# Patient Record
Sex: Female | Born: 1970
Health system: Southern US, Community
[De-identification: ages and names within clinical notes are randomized; demographics above are authoritative.]

## PROBLEM LIST (undated history)

## (undated) DIAGNOSIS — F32A Depression, unspecified: Secondary | ICD-10-CM

## (undated) HISTORY — PX: TONSILLECTOMY: SUR1361

---
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
## 2005-11-30 ENCOUNTER — Ambulatory Visit: Payer: Self-pay | Admitting: Internal Medicine

## 2009-07-03 ENCOUNTER — Ambulatory Visit: Payer: Self-pay | Admitting: Internal Medicine

## 2009-07-03 DIAGNOSIS — R143 Flatulence: Secondary | ICD-10-CM

## 2009-07-03 DIAGNOSIS — K589 Irritable bowel syndrome without diarrhea: Secondary | ICD-10-CM

## 2009-07-03 DIAGNOSIS — R142 Eructation: Secondary | ICD-10-CM

## 2009-07-03 DIAGNOSIS — R141 Gas pain: Secondary | ICD-10-CM | POA: Insufficient documentation

## 2009-07-03 DIAGNOSIS — K5900 Constipation, unspecified: Secondary | ICD-10-CM | POA: Insufficient documentation

## 2009-07-03 DIAGNOSIS — R1084 Generalized abdominal pain: Secondary | ICD-10-CM | POA: Insufficient documentation

## 2009-07-03 DIAGNOSIS — K219 Gastro-esophageal reflux disease without esophagitis: Secondary | ICD-10-CM | POA: Insufficient documentation

## 2009-07-10 LAB — CONVERTED CEMR LAB
ALT: 15 units/L (ref 0–35)
AST: 22 units/L (ref 0–37)
Alkaline Phosphatase: 37 units/L — ABNORMAL LOW (ref 39–117)
Chloride: 108 meq/L (ref 96–112)
Eosinophils Absolute: 0.4 10*3/uL (ref 0.0–0.7)
Eosinophils Relative: 3.6 % (ref 0.0–5.0)
GFR calc non Af Amer: 85.32 mL/min (ref >60)
Lymphocytes Relative: 30.4 % (ref 12.0–46.0)
MCHC: 34.7 g/dL (ref 30.0–36.0)
MCV: 93.4 fL (ref 78.0–100.0)
Monocytes Absolute: 0.6 10*3/uL (ref 0.1–1.0)
Neutrophils Relative %: 59.8 % (ref 43.0–77.0)
Platelets: 220 10*3/uL (ref 150.0–400.0)
Potassium: 4.3 meq/L (ref 3.5–5.1)
RBC: 4.23 M/uL (ref 3.87–5.11)
Sodium: 141 meq/L (ref 135–145)
Total Bilirubin: 0.4 mg/dL (ref 0.3–1.2)
WBC: 11.9 10*3/uL — ABNORMAL HIGH (ref 4.5–10.5)

## 2009-07-23 ENCOUNTER — Ambulatory Visit: Payer: Self-pay | Admitting: Internal Medicine

## 2009-07-23 LAB — CONVERTED CEMR LAB
Fecal Occult Blood: NEGATIVE
OCCULT 1: NEGATIVE
OCCULT 3: NEGATIVE
OCCULT 4: NEGATIVE
OCCULT 5: NEGATIVE

## 2009-08-05 ENCOUNTER — Ambulatory Visit: Payer: Self-pay | Admitting: Internal Medicine

## 2012-02-22 ENCOUNTER — Emergency Department: Payer: Self-pay | Admitting: Emergency Medicine

## 2013-01-17 ENCOUNTER — Ambulatory Visit: Payer: Self-pay | Admitting: Obstetrics and Gynecology

## 2013-01-25 ENCOUNTER — Ambulatory Visit: Payer: Self-pay | Admitting: Obstetrics and Gynecology

## 2014-01-25 ENCOUNTER — Ambulatory Visit: Payer: Self-pay | Admitting: Obstetrics and Gynecology

## 2015-01-24 ENCOUNTER — Other Ambulatory Visit: Payer: Self-pay | Admitting: Obstetrics and Gynecology

## 2015-01-24 DIAGNOSIS — Z1231 Encounter for screening mammogram for malignant neoplasm of breast: Secondary | ICD-10-CM

## 2015-02-19 ENCOUNTER — Ambulatory Visit: Payer: Self-pay

## 2015-02-21 ENCOUNTER — Ambulatory Visit
Admission: RE | Admit: 2015-02-21 | Discharge: 2015-02-21 | Disposition: A | Discharge status: Home / Self Care | Payer: 59 | Source: Ambulatory Visit | Attending: Obstetrics and Gynecology | Admitting: Obstetrics and Gynecology

## 2015-02-21 DIAGNOSIS — Z1231 Encounter for screening mammogram for malignant neoplasm of breast: Secondary | ICD-10-CM | POA: Diagnosis not present

## 2016-01-28 ENCOUNTER — Other Ambulatory Visit: Payer: Self-pay | Admitting: Obstetrics and Gynecology

## 2016-01-28 DIAGNOSIS — Z1231 Encounter for screening mammogram for malignant neoplasm of breast: Secondary | ICD-10-CM

## 2016-02-28 ENCOUNTER — Ambulatory Visit: Payer: 59

## 2016-03-09 ENCOUNTER — Ambulatory Visit
Admission: RE | Admit: 2016-03-09 | Discharge: 2016-03-09 | Disposition: A | Discharge status: Home / Self Care | Payer: 59 | Source: Ambulatory Visit | Attending: Obstetrics and Gynecology | Admitting: Obstetrics and Gynecology

## 2016-03-09 DIAGNOSIS — Z1231 Encounter for screening mammogram for malignant neoplasm of breast: Secondary | ICD-10-CM

## 2016-06-07 IMAGING — MG MM DIGITAL SCREENING BILAT W/ CAD
6 series · 6 of 6 positions shown · non-contrast
Comparison: Previous exam(s).

CLINICAL DATA: Screening.

EXAM:
DIGITAL SCREENING BILATERAL MAMMOGRAM WITH CAD

[R MLO (1 of 2)]
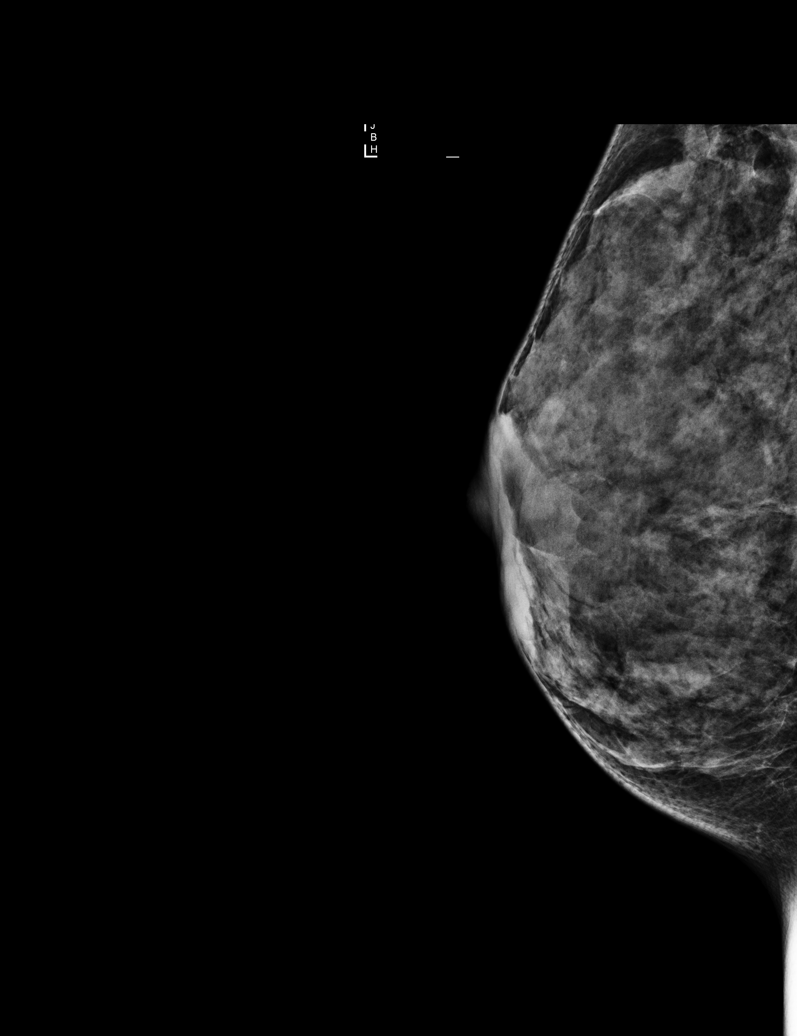

[L MLO (1 of 2)]
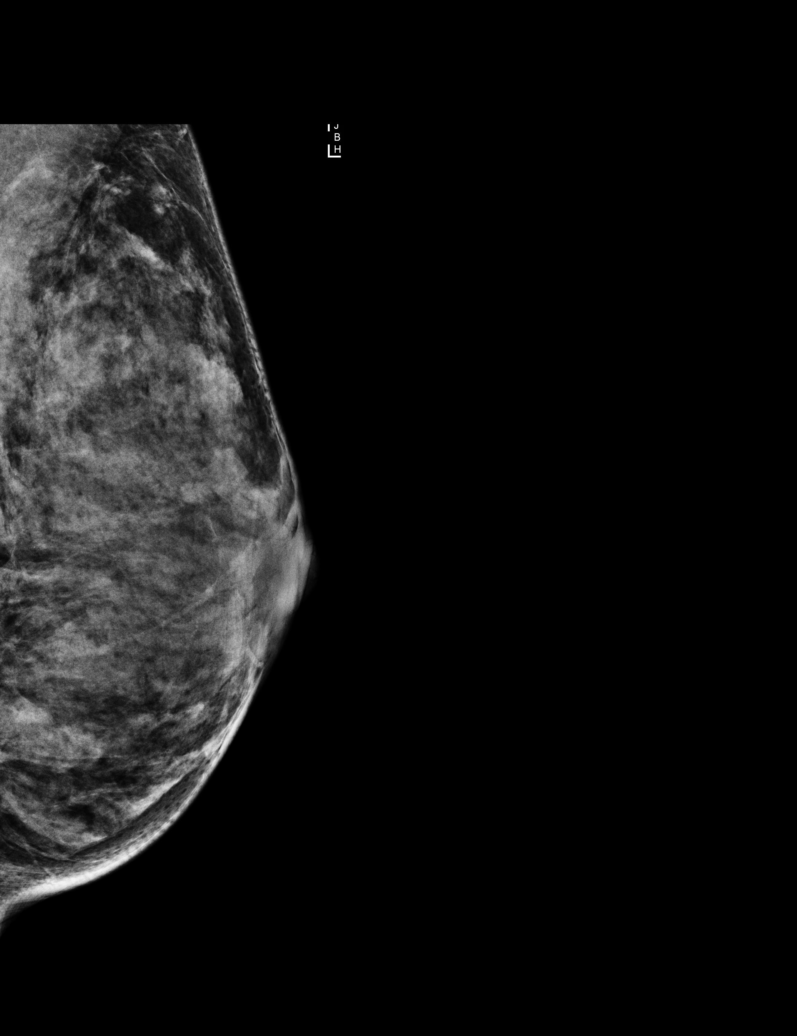

[L MLO (2 of 2)]
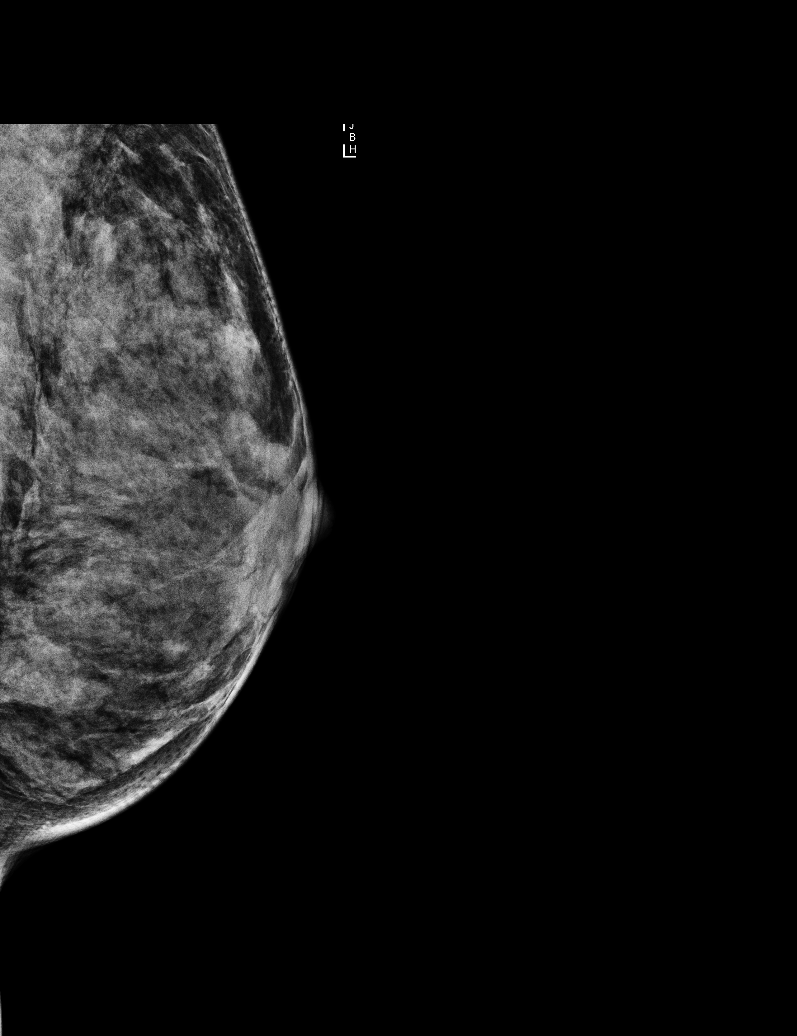

[R CC]
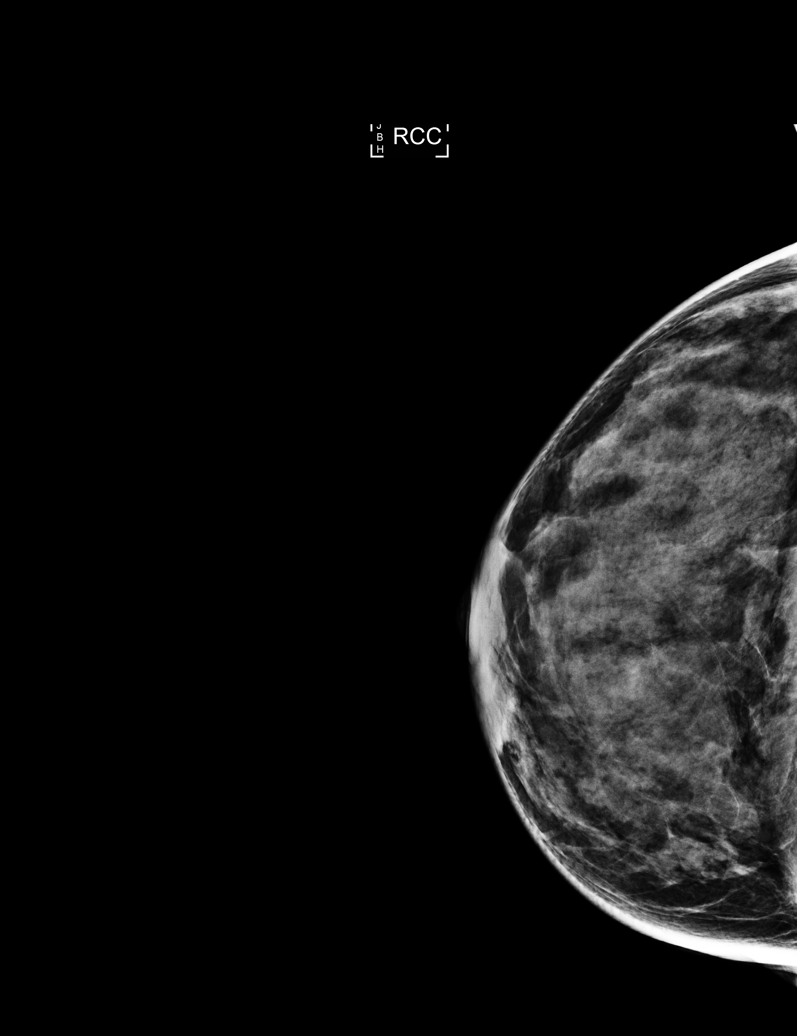

[L CC]
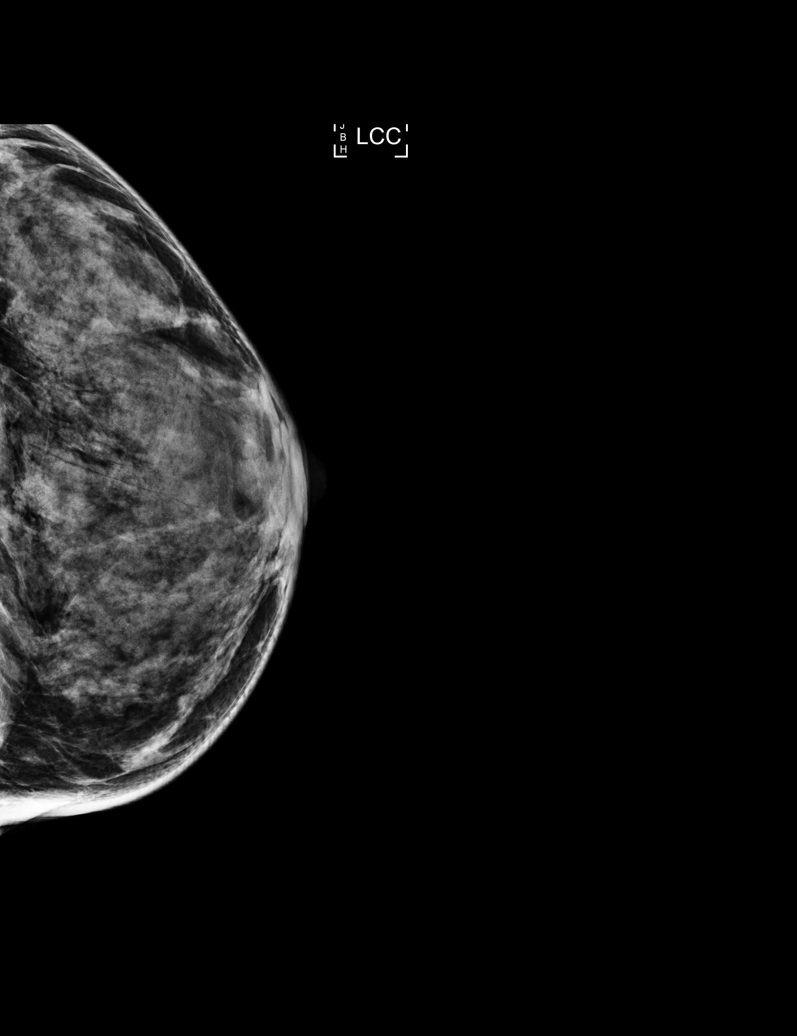

[R MLO (2 of 2)]
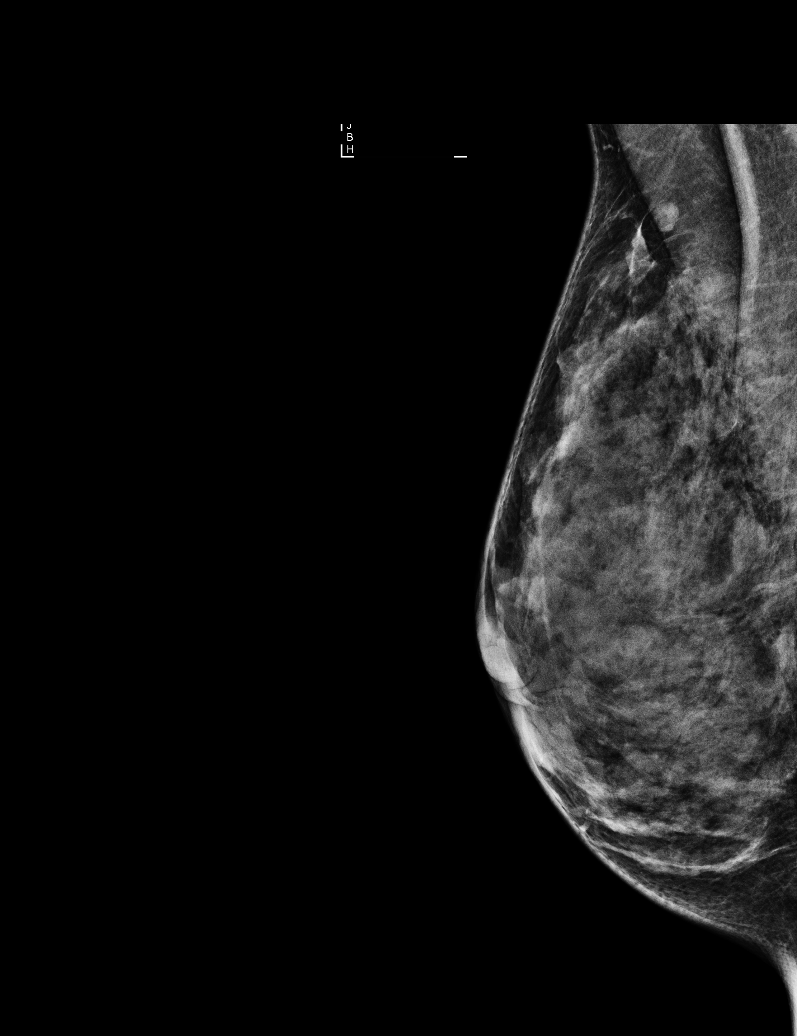

[6 of 6 positions shown; findings below may reference images not displayed]

ACR Breast Density Category d: The breast tissue is extremely dense,
which lowers the sensitivity of mammography.
FINDINGS: There are no findings suspicious for malignancy. Images were
processed with CAD.
IMPRESSION: No mammographic evidence of malignancy. A result letter of this
screening mammogram will be mailed directly to the patient.

RECOMMENDATION:
Screening mammogram in one year. (Code:[id number])

BI-RADS CATEGORY  1: Negative.

## 2016-12-28 ENCOUNTER — Emergency Department
Admission: EM | Admit: 2016-12-28 | Discharge: 2016-12-28 | Disposition: A | Discharge status: Home / Self Care | Payer: 59 | Attending: Student in an Organized Health Care Education/Training Program | Admitting: Student in an Organized Health Care Education/Training Program

## 2016-12-28 DIAGNOSIS — F17210 Nicotine dependence, cigarettes, uncomplicated: Secondary | ICD-10-CM | POA: Insufficient documentation

## 2016-12-28 DIAGNOSIS — F410 Panic disorder [episodic paroxysmal anxiety] without agoraphobia: Secondary | ICD-10-CM

## 2016-12-28 DIAGNOSIS — G4700 Insomnia, unspecified: Secondary | ICD-10-CM | POA: Insufficient documentation

## 2016-12-28 LAB — URINALYSIS, COMPLETE (UACMP) WITH MICROSCOPIC
BILIRUBIN URINE: NEGATIVE
Bacteria, UA: NONE SEEN
GLUCOSE, UA: NEGATIVE mg/dL
KETONES UR: NEGATIVE mg/dL
LEUKOCYTES UA: NEGATIVE
NITRITE: NEGATIVE
PH: 8 (ref 5.0–8.0)
Protein, ur: NEGATIVE mg/dL
Specific Gravity, Urine: 1.002 — ABNORMAL LOW (ref 1.005–1.030)

## 2016-12-28 LAB — BASIC METABOLIC PANEL
ANION GAP: 8 (ref 5–15)
BUN: 10 mg/dL (ref 6–20)
CALCIUM: 9.8 mg/dL (ref 8.9–10.3)
CO2: 28 mmol/L (ref 22–32)
CREATININE: 0.67 mg/dL (ref 0.44–1.00)
Chloride: 103 mmol/L (ref 101–111)
GFR calc Af Amer: >60 mL/min (ref >60)
GFR calc non Af Amer: >60 mL/min (ref >60)
GLUCOSE: 85 mg/dL (ref 65–99)
Potassium: 4.6 mmol/L (ref 3.5–5.1)
Sodium: 139 mmol/L (ref 135–145)

## 2016-12-28 MED ORDER — ZOLPIDEM TARTRATE ER 12.5 MG PO TBCR: 12.5000 mg | EXTENDED_RELEASE_TABLET | Freq: Every evening | ORAL | 1 refills | Status: DC | PRN

## 2016-12-28 NOTE — ED Provider Notes (Signed)
[facility] Emergency Department Provider Note    First MD Initiated Contact with Patient 12/28/16 0848     (approximate)  I have reviewed the triage vital signs and the nursing notes.   HISTORY  Chief Complaint Anxiety    HPI Jacqueline Wolfe is a 46 y.o. female presents with anxiety, insomnia and weight loss over the past month. Patient states that all his symptoms started back when she withdrew her hairdresser who accidentally dyed her hair black. Ever since then she had a severe panic attack on that night after seeing Dr. Isabel Capriceurkin is since then she says every time she looks and the mayor or gets in the shower she starts having a panic attack and that her hair is falling out. She's had 16 pound weight loss over the past month that is been unintentional. She has no appetite. No vomiting. Denies any fevers. She went to her primary care doctor who checked her thyroid levels which were normal. Patient states that she's been drinking lots of Water as it is only thing that she wants to keep down.  She denies any SI or HI.   History reviewed. No pertinent past medical history. Family History  Problem Relation Age of Onset  . Breast cancer Paternal Aunt [id number]  . Breast cancer Sister [id number]   Past Surgical History:  Procedure Laterality Date  . TONSILLECTOMY     Patient Active Problem List   Diagnosis Date Noted  . GERD 07/03/2009  . CONSTIPATION 07/03/2009  . IRRITABLE BOWEL SYNDROME 07/03/2009  . FLATULENCE-GAS-BLOATING 07/03/2009  . ABDOMINAL PAIN -GENERALIZED 07/03/2009      Prior to Admission medications   Medication Sig Start Date End Date Taking? Authorizing Provider  zolpidem (AMBIEN CR) 12.5 MG CR tablet Take 1 tablet (12.5 mg total) by mouth at bedtime as needed for sleep. 12/28/16 12/28/17  Willy EddyPatrick Karmina Zufall, MD    Allergies Patient has no known allergies.    Social History Social History  Substance Use Topics  . Smoking status: Current Every Day  Smoker    Types: Cigarettes  . Smokeless tobacco: Never Used  . Alcohol use No    Review of Systems Patient denies headaches, rhinorrhea, blurry vision, numbness, shortness of breath, chest pain, edema, cough, abdominal pain, nausea, vomiting, diarrhea, dysuria, fevers, rashes or hallucinations unless otherwise stated above in HPI. ____________________________________________   PHYSICAL EXAM:  VITAL SIGNS: Vitals:   12/28/16 0821 12/28/16 0930  BP: 131/84 128/72  Pulse: (!) 101 91  Resp: 17 16  Temp: 98.7 F (37.1 C)     Constitutional: Alert and oriented. anxious appearing but in no acute distress. Eyes: Conjunctivae are normal. PERRL. EOMI. Head: Atraumatic. Nose: No congestion/rhinnorhea. Mouth/Throat: Mucous membranes are moist.  Oropharynx non-erythematous. Neck: No stridor. Painless ROM. No cervical spine tenderness to palpation Hematological/Lymphatic/Immunilogical: No cervical lymphadenopathy. Cardiovascular: Normal rate, regular rhythm. Grossly normal heart sounds.  Good peripheral circulation. Respiratory: Normal respiratory effort.  No retractions. Lungs CTAB. Gastrointestinal: Soft and nontender. No distention. No abdominal bruits. No CVA tenderness. Musculoskeletal: No lower extremity tenderness nor edema.  No joint effusions. Neurologic:  Normal speech and language. No gross focal neurologic deficits are appreciated. No gait instability. Skin:  Skin is warm, dry and intact. No rash noted. Psychiatric: well kempt, organized, Mood is anxious, normal thought process, good insight, no SI/HI.    ____________________________________________   LABS (all labs ordered are listed, but only abnormal results are displayed)  Results for orders placed or performed during the  hospital encounter of 12/28/16 (from the past 24 hour(s))  Urinalysis, Complete w Microscopic     Status: Abnormal   Collection Time: 12/28/16  9:25 AM  Result Value Ref Range   Color, Urine STRAW  (A) YELLOW   APPearance CLEAR (A) CLEAR   Specific Gravity, Urine 1.002 (L) 1.005 - 1.030   pH 8.0 5.0 - 8.0   Glucose, UA NEGATIVE NEGATIVE mg/dL   Hgb urine dipstick SMALL (A) NEGATIVE   Bilirubin Urine NEGATIVE NEGATIVE   Ketones, ur NEGATIVE NEGATIVE mg/dL   Protein, ur NEGATIVE NEGATIVE mg/dL   Nitrite NEGATIVE NEGATIVE   Leukocytes, UA NEGATIVE NEGATIVE   RBC / HPF 0-5 0 - 5 RBC/hpf   WBC, UA 0-5 0 - 5 WBC/hpf   Bacteria, UA NONE SEEN NONE SEEN   Squamous Epithelial / LPF 0-5 (A) NONE SEEN  Basic metabolic panel     Status: None   Collection Time: 12/28/16  9:25 AM  Result Value Ref Range   Sodium 139 135 - 145 mmol/L   Potassium 4.6 3.5 - 5.1 mmol/L   Chloride 103 101 - 111 mmol/L   CO2 28 22 - 32 mmol/L   Glucose, Bld 85 65 - 99 mg/dL   BUN 10 6 - 20 mg/dL   Creatinine, Ser 7.82 0.44 - 1.00 mg/dL   Calcium 9.8 8.9 - 95.6 mg/dL   GFR calc non Af Amer >60 >60 mL/min   GFR calc Af Amer >60 >60 mL/min   Anion gap 8 5 - 15   ____________________________________________ ____________________________________________  RADIOLOGY   ____________________________________________   PROCEDURES  Procedure(s) performed:  Procedures    Critical Care performed: no ____________________________________________   INITIAL IMPRESSION / ASSESSMENT AND PLAN / ED COURSE  Pertinent labs & imaging results that were available during my care of the patient were reviewed by me and considered in my medical decision making (see chart for details).  DDX: .ddxpsych   Marcedes Tech is a 46 y.o. who presents to the ED with Above complaints arise in no acute distress. Patient very anxious. Physical exam is reassuring. Blood work ordered to evaluate for any evidence of electrolyte abnormality given her significant water intake as well as decreased food intake. There is no evidence of ketosis to suggest significant starvation. Blood work is reassuring. Patient without any SI or HI. TTS came  to evaluate the patient at bedside and provide outpatient referral and follow-up services. Do not feel patient requires any further diagnostic testing at this time. Patient stable for discharge home.  Have discussed with the patient and available family all diagnostics and treatments performed thus far and all questions were answered to the best of my ability. The patient demonstrates understanding and agreement with plan.       ____________________________________________   FINAL CLINICAL IMPRESSION(S) / ED DIAGNOSES  Final diagnoses:  Panic attack  Insomnia, unspecified type      NEW MEDICATIONS STARTED DURING THIS VISIT:  New Prescriptions   ZOLPIDEM (AMBIEN CR) 12.5 MG CR TABLET    Take 1 tablet (12.5 mg total) by mouth at bedtime as needed for sleep.     Note:  This document was prepared using Dragon voice recognition software and may include unintentional dictation errors.    Willy Eddy, MD 12/28/16 1031

## 2016-12-28 NOTE — BH Assessment (Signed)
Assessment Note  Jacqueline Wolfe is an 46 y.o. female who presents to the ER due to increase anxiety and panic attacks. She states, she was seen by her PCP and he recommended she come to the ER for a behavioral health assessment. She initially thought she was having trouble with her thyroid, because of the mood changes.  Patient states, her mood started to change, following a recent hair change. Patient has talked about dying her hair for approximately ten years. In February, when she had the change, her hairdresser dyed it black, the color she didn't want. She specifically wanted brown and wanted to be temporary. Now that her hair is starting to "breakoff." She's waking up in the middle of the night, out of fear all of her hair will be on the people. She's having anxiety of taking showers, because when her hair is wet it breakoff.  During the interview, the patient minimized her symptoms. "I know it sounds crazy. I know it's not that serious but I can't hide my emotions.The fact I didn't clean my house this weekend is a problem. I'm very OCD and I know this. I clean up every weekend. But I didn't have the strength to do it. My dog, who I love so dearly, I didn't even want to be bother by her. That's when I knew, this was a problem." She has had a lack of sleep. Poor appetite and increase feelings of wanting to be alone. She having an increase of anxiety about going to work, out of fear she's going to have a panic attack.  Patient denies SI/HI and AV/H.  Diagnosis: Anxiety    Past Medical History: History reviewed. No pertinent past medical history.  Past Surgical History:  Procedure Laterality Date  . TONSILLECTOMY      Family History:  Family History  Problem Relation Age of Onset  . Breast cancer Paternal Aunt [id number]  . Breast cancer Sister [id number]    Social History:  reports that she has been smoking Cigarettes.  She has never used smokeless tobacco. She reports that she does not drink alcohol. Her  drug history is not on file.  Additional Social History:  Alcohol / Drug Use Pain Medications: See PTA Prescriptions: See PTA Over the Counter: See PTA History of alcohol / drug use?: No history of alcohol / drug abuse Longest period of sobriety (when/how long): n/a Negative Consequences of Use:  (n/a) Withdrawal Symptoms:  (n/a)  CIWA: CIWA-Ar BP: 118/72 Pulse Rate: 89 COWS:    Allergies: No Known Allergies  Home Medications:  (Not in a hospital admission)  OB/GYN Status:  No LMP recorded. Patient is not currently having periods (Reason: Perimenopausal).  General Assessment Data Location of Assessment: [facility] ED TTS Assessment: In system Is this a Tele or Face-to-Face Assessment?: Face-to-Face Is this an Initial Assessment or a Re-assessment for this encounter?: Initial Assessment Marital status: Married SycamoreMaiden name: n/a Is patient pregnant?: No Pregnancy Status: No Living Arrangements: Spouse/significant other Can pt return to current living arrangement?: Yes Admission Status: Voluntary Is patient capable of signing voluntary admission?: Yes Referral Source: Other Insurance type: n/a  Medical Screening Exam [facility] Walk-in ONLY) Medical Exam completed: Yes  Crisis Care Plan Living Arrangements: Spouse/significant other Legal Guardian: Other: (Self) Name of Psychiatrist: Reports of none Name of Therapist: Reports of none  Education Status Is patient currently in school?: No Current Grade: n/a Highest grade of school patient has completed: High School Diploma Name of school: n/a Contact person: n/a  Risk to self with the past 6 months Suicidal Ideation: No Has patient been a risk to self within the past 6 months prior to admission? : No Suicidal Intent: No Has patient had any suicidal intent within the past 6 months prior to admission? : No Is patient at risk for suicide?: No Suicidal Plan?: No Has patient had any suicidal plan within the past 6 months prior  to admission? : No Access to Means: No What has been your use of drugs/alcohol within the last 12 months?: Reports of none Previous Attempts/Gestures: No How many times?: 0 Other Self Harm Risks: Reports of none Triggers for Past Attempts: None known Intentional Self Injurious Behavior: None Family Suicide History: No Recent stressful life event(s): Other (Comment) (Job stress and recent change in hair.) Persecutory voices/beliefs?: No Depression: Yes Depression Symptoms: Tearfulness, Isolating, Fatigue, Loss of interest in usual pleasures, Feeling worthless/self pity, Insomnia Substance abuse history and/or treatment for substance abuse?: No Suicide prevention information given to non-admitted patients: Not applicable  Risk to Others within the past 6 months Homicidal Ideation: No Does patient have any lifetime risk of violence toward others beyond the six months prior to admission? : No Thoughts of Harm to Others: No Current Homicidal Intent: No Current Homicidal Plan: No Access to Homicidal Means: No Identified Victim: Reports of none History of harm to others?: No Assessment of Violence: None Noted Violent Behavior Description: Reports of none Does patient have access to weapons?: No Criminal Charges Pending?: No Does patient have a court date: No Is patient on probation?: No  Psychosis Hallucinations: None noted Delusions: None noted  Mental Status Report Appearance/Hygiene: Unremarkable, In hospital gown Eye Contact: Good Motor Activity: Freedom of movement, Unremarkable Speech: Logical/coherent, Unremarkable Level of Consciousness: Alert Mood: Depressed, Anxious, Pleasant Affect: Appropriate to circumstance, Depressed, Anxious, Sad Anxiety Level: Minimal Thought Processes: Coherent, Relevant Judgement: Unimpaired Orientation: Person, Place, Time, Situation, Appropriate for developmental age Obsessive Compulsive Thoughts/Behaviors: Minimal  Cognitive  Functioning Concentration: Normal Memory: Recent Intact, Remote Intact IQ: Average Insight: Good Impulse Control: Good Appetite: Fair (Approximately a year) Weight Loss: 0 (Unknown, but she can tell a difference.) Weight Gain: 0 Sleep: Decreased Total Hours of Sleep: 4 (Trouble falling and staying asleep.) Vegetative Symptoms: None  ADLScreening [facility] Assessment Services) Patient's cognitive ability adequate to safely complete daily activities?: Yes Patient able to express need for assistance with ADLs?: Yes Independently performs ADLs?: Yes (appropriate for developmental age)  Prior Inpatient Therapy Prior Inpatient Therapy: No Prior Therapy Dates: Reports of none Prior Therapy Facilty/Provider(s): Reports of none Reason for Treatment: Reports of none  Prior Outpatient Therapy Prior Outpatient Therapy: No Prior Therapy Dates: Reports of none Prior Therapy Facilty/Provider(s): Reports of none Reason for Treatment: Reports of none Does patient have an ACCT team?: No Does patient have Intensive In-House Services?  : No Does patient have [facility] services? : No Does patient have P4CC services?: No  ADL Screening (condition at time of admission) Patient's cognitive ability adequate to safely complete daily activities?: Yes Is the patient deaf or have difficulty hearing?: No Does the patient have difficulty seeing, even when wearing glasses/contacts?: No Does the patient have difficulty concentrating, remembering, or making decisions?: No Patient able to express need for assistance with ADLs?: Yes Does the patient have difficulty dressing or bathing?: No Independently performs ADLs?: Yes (appropriate for developmental age) Does the patient have difficulty walking or climbing stairs?: No Weakness of Legs: None Weakness of Arms/Hands: None  Home Assistive Devices/Equipment Home Assistive Devices/Equipment:  None  Therapy Consults (therapy consults require a physician order) PT  Evaluation Needed: No OT Evalulation Needed: No SLP Evaluation Needed: No Abuse/Neglect Assessment (Assessment to be complete while patient is alone) Physical Abuse: Denies Verbal Abuse: Denies Sexual Abuse: Denies Exploitation of patient/patient's resources: Denies Self-Neglect: Denies Values / Beliefs Cultural Requests During Hospitalization: None Spiritual Requests During Hospitalization: None Consults Spiritual Care Consult Needed: No Social Work Consult Needed: No Merchant navy officer (For Healthcare) Does Patient Have a Medical Advance Directive?: No Would patient like information on creating a medical advance directive?: No - Patient declined    Additional Information 1:1 In Past 12 Months?: No CIRT Risk: No Elopement Risk: No Does patient have medical clearance?: Yes  Child/Adolescent Assessment Running Away Risk: Denies (Patient is an adult)  Disposition:  Discussed patient with ER MD (Dr. Roxan Hockey) and patient is able to discharge home when medically cleared. Patient was giving referral information and instructions on how to follow up with Outpatient Treatment [facility], [address] [place], [place] [address]. [phone]) and Mobile Crisis.  Writer also advised the patient to call the toll free phone on his insurance card for the counselors and agencies in his network.  Patient advised to call the [facility] Program through their job to help with Outpatient Treatment.  Patient denies SI/HI and AV/H.  On Site Evaluation by:   Reviewed with Physician:    Lilyan Gilford MS, LCAS, LPC, NCC, CCSI Therapeutic Triage Specialist 12/28/2016 10:25am

## 2016-12-28 NOTE — ED Triage Notes (Signed)
Pt c/o feeling very anxious, states she was seen by here PCP recently and they checked her thyroid which was normal but states she is going through menopause, states she has not been able to sleep, doesn't have an appetite, states she feels like she is going crazy..Marland Kitchen

## 2017-01-28 ENCOUNTER — Other Ambulatory Visit: Payer: Self-pay | Admitting: Obstetrics and Gynecology

## 2017-01-28 DIAGNOSIS — Z1231 Encounter for screening mammogram for malignant neoplasm of breast: Secondary | ICD-10-CM

## 2017-03-10 ENCOUNTER — Ambulatory Visit
Admission: RE | Admit: 2017-03-10 | Discharge: 2017-03-10 | Disposition: A | Discharge status: Home / Self Care | Payer: 59 | Source: Ambulatory Visit | Attending: Obstetrics and Gynecology | Admitting: Obstetrics and Gynecology

## 2017-03-10 DIAGNOSIS — Z1231 Encounter for screening mammogram for malignant neoplasm of breast: Secondary | ICD-10-CM | POA: Diagnosis present

## 2017-10-15 DIAGNOSIS — J019 Acute sinusitis, unspecified: Secondary | ICD-10-CM | POA: Diagnosis not present

## 2018-05-03 DIAGNOSIS — Z01419 Encounter for gynecological examination (general) (routine) without abnormal findings: Secondary | ICD-10-CM | POA: Diagnosis not present

## 2018-05-04 ENCOUNTER — Other Ambulatory Visit: Payer: Self-pay | Admitting: Obstetrics and Gynecology

## 2018-05-04 DIAGNOSIS — Z1231 Encounter for screening mammogram for malignant neoplasm of breast: Secondary | ICD-10-CM

## 2018-05-26 ENCOUNTER — Ambulatory Visit
Admission: RE | Admit: 2018-05-26 | Discharge: 2018-05-26 | Disposition: A | Discharge status: Home / Self Care | Payer: 59 | Source: Ambulatory Visit | Attending: Obstetrics and Gynecology | Admitting: Obstetrics and Gynecology

## 2018-05-26 DIAGNOSIS — Z1231 Encounter for screening mammogram for malignant neoplasm of breast: Secondary | ICD-10-CM | POA: Insufficient documentation

## 2018-07-15 DIAGNOSIS — H16142 Punctate keratitis, left eye: Secondary | ICD-10-CM | POA: Diagnosis not present

## 2018-09-10 DIAGNOSIS — J209 Acute bronchitis, unspecified: Secondary | ICD-10-CM | POA: Diagnosis not present

## 2019-11-10 ENCOUNTER — Other Ambulatory Visit: Payer: 59

## 2020-01-18 ENCOUNTER — Other Ambulatory Visit: Payer: Self-pay | Admitting: Obstetrics and Gynecology

## 2020-01-18 DIAGNOSIS — Z1231 Encounter for screening mammogram for malignant neoplasm of breast: Secondary | ICD-10-CM

## 2020-06-07 ENCOUNTER — Ambulatory Visit
Admission: RE | Admit: 2020-06-07 | Discharge: 2020-06-07 | Disposition: A | Discharge status: Home / Self Care | Payer: 59 | Source: Ambulatory Visit | Attending: Obstetrics and Gynecology | Admitting: Obstetrics and Gynecology

## 2020-06-07 ENCOUNTER — Other Ambulatory Visit: Payer: Self-pay

## 2020-06-07 DIAGNOSIS — Z1231 Encounter for screening mammogram for malignant neoplasm of breast: Secondary | ICD-10-CM | POA: Insufficient documentation

## 2020-11-08 ENCOUNTER — Ambulatory Visit: Payer: Self-pay | Admitting: Nurse Practitioner

## 2020-11-08 ENCOUNTER — Other Ambulatory Visit: Payer: Self-pay

## 2020-11-08 ENCOUNTER — Ambulatory Visit: Payer: Self-pay

## 2020-11-08 DIAGNOSIS — Z20822 Contact with and (suspected) exposure to covid-19: Secondary | ICD-10-CM

## 2020-11-08 LAB — POC COVID19 BINAXNOW: SARS Coronavirus 2 Ag: NEGATIVE

## 2021-01-22 ENCOUNTER — Other Ambulatory Visit: Payer: Self-pay | Admitting: Obstetrics and Gynecology

## 2021-01-22 DIAGNOSIS — Z1231 Encounter for screening mammogram for malignant neoplasm of breast: Secondary | ICD-10-CM

## 2021-10-21 ENCOUNTER — Other Ambulatory Visit: Payer: Self-pay

## 2021-10-21 ENCOUNTER — Ambulatory Visit
Admission: RE | Admit: 2021-10-21 | Discharge: 2021-10-21 | Disposition: A | Discharge status: Home / Self Care | Payer: BC Managed Care – PPO | Source: Ambulatory Visit | Attending: Obstetrics and Gynecology | Admitting: Obstetrics and Gynecology

## 2021-10-21 DIAGNOSIS — Z1231 Encounter for screening mammogram for malignant neoplasm of breast: Secondary | ICD-10-CM | POA: Diagnosis present

## 2022-08-01 DIAGNOSIS — S42009A Fracture of unspecified part of unspecified clavicle, initial encounter for closed fracture: Secondary | ICD-10-CM

## 2022-08-01 HISTORY — DX: Fracture of unspecified part of unspecified clavicle, initial encounter for closed fracture: S42.009A

## 2022-11-11 ENCOUNTER — Other Ambulatory Visit: Payer: Self-pay | Admitting: Surgery

## 2022-11-11 DIAGNOSIS — S42022D Displaced fracture of shaft of left clavicle, subsequent encounter for fracture with routine healing: Secondary | ICD-10-CM

## 2022-11-12 ENCOUNTER — Encounter
Admission: RE | Admit: 2022-11-12 | Discharge: 2022-11-12 | Disposition: A | Discharge status: Home / Self Care | Payer: Commercial Managed Care - PPO | Source: Ambulatory Visit | Attending: Surgery | Admitting: Surgery

## 2022-11-12 ENCOUNTER — Ambulatory Visit
Admission: RE | Admit: 2022-11-12 | Discharge: 2022-11-12 | Disposition: A | Discharge status: Home / Self Care | Payer: Commercial Managed Care - PPO | Source: Ambulatory Visit | Attending: Surgery | Admitting: Surgery

## 2022-11-12 DIAGNOSIS — S42022D Displaced fracture of shaft of left clavicle, subsequent encounter for fracture with routine healing: Secondary | ICD-10-CM | POA: Diagnosis present

## 2022-11-12 HISTORY — DX: Depression, unspecified: F32.A

## 2022-11-12 NOTE — Patient Instructions (Addendum)
Your procedure is scheduled on: Tuesday, February 13 Report to the Registration Desk on the 1st floor of the [facility]. To find out your arrival time, please call [phone] between 1PM - 3PM on: Monday, February 12 If your arrival time is 6:00 am, do not arrive before that time as the [facility] entrance doors do not open until 6:00 am.  REMEMBER: Instructions that are not followed completely may result in serious medical risk, up to and including death; or upon the discretion of your surgeon and anesthesiologist your surgery may need to be rescheduled.  Do not eat food after midnight the night before surgery.  No gum chewing or hard candies.  You may however, drink CLEAR liquids up to 2 hours before you are scheduled to arrive for your surgery. Do not drink anything within 2 hours of your scheduled arrival time.  Clear liquids include: - water  - apple juice without pulp - gatorade (not RED colors) - black coffee or tea (Do NOT add milk or creamers to the coffee or tea) Do NOT drink anything that is not on this list.  In addition, your doctor has ordered for you to drink the provided:  Ensure Pre-Surgery Clear Carbohydrate Drink  Drinking this carbohydrate drink up to two hours before surgery helps to reduce insulin resistance and improve patient outcomes. Please complete drinking 2 hours before scheduled arrival time.  One week prior to surgery: starting today, February 8 Stop meloxicam and Anti-inflammatories (NSAIDS) such as Advil, Aleve, Ibuprofen, Motrin, Naproxen, Naprosyn and Aspirin based products such as Excedrin, Goody's Powder, BC Powder. Stop ANY OVER THE COUNTER supplements until after surgery. Vitamin C and calcium gummies You may however, continue to take Tylenol if needed for pain up until the day of surgery.  Continue taking all prescribed medications  DO NOT TAKE ANY MEDICATIONS THE MORNING OF SURGERY  No Alcohol for 24 hours before or after  surgery.  No Smoking including e-cigarettes for 24 hours before surgery.  No chewable tobacco products for at least 6 hours before surgery.  No nicotine patches on the day of surgery.  Do not use any "recreational" drugs for at least a week (preferably 2 weeks) before your surgery.  Please be advised that the combination of cocaine and anesthesia may have negative outcomes, up to and including death. If you test positive for cocaine, your surgery will be cancelled.  On the morning of surgery brush your teeth with toothpaste and water, you may rinse your mouth with mouthwash if you wish. Do not swallow any toothpaste or mouthwash.  Use CHG Soap as directed on instruction sheet.  Do not wear jewelry, make-up, hairpins, clips or nail polish.  Do not wear lotions, powders, or perfumes.   Do not shave body hair from the neck down 48 hours before surgery.  Contact lenses, hearing aids and dentures may not be worn into surgery.  Do not bring valuables to the hospital. [facility] is not responsible for any missing/lost belongings or valuables.   Notify your doctor if there is any change in your medical condition (cold, fever, infection).  Wear comfortable clothing (specific to your surgery type) to the hospital.  After surgery, you can help prevent lung complications by doing breathing exercises.  Take deep breaths and cough every 1-2 hours. Your doctor may order a device called an Incentive Spirometer to help you take deep breaths.  If you are being discharged the day of surgery, you will not be allowed to  drive home. You will need a responsible individual to drive you home and stay with you for 24 hours after surgery.   If you are taking public transportation, you will need to have a responsible individual with you.  Please call the [facility] Dept. at [phone] if you have any questions about these instructions.  Surgery Visitation Policy:  Patients undergoing a  surgery or procedure may have two family members or support persons with them as long as the person is not COVID-19 positive or experiencing its symptoms.      Preparing for Surgery with CHLORHEXIDINE GLUCONATE (CHG) Soap  Chlorhexidine Gluconate (CHG) Soap  o An antiseptic cleaner that kills germs and bonds with the skin to continue killing germs even after washing  o Used for showering the night before surgery and morning of surgery  Before surgery, you can play an important role by reducing the number of germs on your skin.  CHG (Chlorhexidine gluconate) soap is an antiseptic cleanser which kills germs and bonds with the skin to continue killing germs even after washing.  Please do not use if you have an allergy to CHG or antibacterial soaps. If your skin becomes reddened/irritated stop using the CHG.  1. Shower the NIGHT BEFORE SURGERY and the MORNING OF SURGERY with CHG soap.  2. If you choose to wash your hair, wash your hair first as usual with your normal shampoo.  3. After shampooing, rinse your hair and body thoroughly to remove the shampoo.  4. Use CHG as you would any other liquid soap. You can apply CHG directly to the skin and wash gently with a scrungie or a clean washcloth.  5. Apply the CHG soap to your body only from the neck down. Do not use on open wounds or open sores. Avoid contact with your eyes, ears, mouth, and genitals (private parts). Wash face and genitals (private parts) with your normal soap.  6. Wash thoroughly, paying special attention to the area where your surgery will be performed.  7. Thoroughly rinse your body with warm water.  8. Do not shower/wash with your normal soap after using and rinsing off the CHG soap.  9. Pat yourself dry with a clean towel.  10. Wear clean pajamas to bed the night before surgery.  12. Place clean sheets on your bed the night of your first shower and do not sleep with pets.  13. Shower again with the CHG soap on  the day of surgery prior to arriving at the hospital.  14. Do not apply any deodorants/lotions/powders.  15. Please wear clean clothes to the hospital.

## 2022-11-16 MED ORDER — FAMOTIDINE 20 MG PO TABS: 20.0000 mg | ORAL_TABLET | Freq: Once | ORAL | Status: AC

## 2022-11-16 MED ORDER — ORAL CARE MOUTH RINSE: 15.0000 mL | Freq: Once | OROMUCOSAL | Status: AC

## 2022-11-16 MED ORDER — CHLORHEXIDINE GLUCONATE 0.12 % MT SOLN: 15.0000 mL | Freq: Once | OROMUCOSAL | Status: AC

## 2022-11-16 MED ORDER — CEFAZOLIN SODIUM-DEXTROSE 2-4 GM/100ML-% IV SOLN: 2.0000 g | INTRAVENOUS | Status: DC

## 2022-11-16 MED ORDER — LACTATED RINGERS IV SOLN: INTRAVENOUS | Status: DC

## 2022-11-17 ENCOUNTER — Ambulatory Visit
Admission: RE | Admit: 2022-11-17 | Discharge: 2022-11-17 | Disposition: A | Discharge status: Home / Self Care | Payer: Commercial Managed Care - PPO | Source: Ambulatory Visit | Attending: Surgery | Admitting: Surgery

## 2022-11-17 ENCOUNTER — Encounter
Admission: RE | Disposition: A | Discharge status: Home / Self Care | Payer: Self-pay | Source: Ambulatory Visit | Attending: Surgery

## 2022-11-17 ENCOUNTER — Ambulatory Visit: Payer: Commercial Managed Care - PPO | Admitting: Registered Nurse

## 2022-11-17 ENCOUNTER — Encounter: Payer: Self-pay | Admitting: Surgery

## 2022-11-17 ENCOUNTER — Other Ambulatory Visit: Payer: Self-pay

## 2022-11-17 DIAGNOSIS — F32A Depression, unspecified: Secondary | ICD-10-CM | POA: Diagnosis not present

## 2022-11-17 DIAGNOSIS — Z8781 Personal history of (healed) traumatic fracture: Secondary | ICD-10-CM | POA: Insufficient documentation

## 2022-11-17 DIAGNOSIS — F17200 Nicotine dependence, unspecified, uncomplicated: Secondary | ICD-10-CM | POA: Insufficient documentation

## 2022-11-17 DIAGNOSIS — M7502 Adhesive capsulitis of left shoulder: Secondary | ICD-10-CM | POA: Insufficient documentation

## 2022-11-17 HISTORY — PX: CLOSED MANIPULATION SHOULDER WITH STERIOD INJECTION: SHX5611

## 2022-11-17 SURGERY — CLOSED MANIPULATION SHOULDER WITH STEROID INJECTION
Anesthesia: General | Site: Shoulder | Laterality: Left

## 2022-11-17 MED ORDER — OXYCODONE HCL 5 MG PO TABS
ORAL_TABLET | ORAL | Status: AC
  Filled 2022-11-17: qty 1

## 2022-11-17 MED ORDER — OXYCODONE HCL 5 MG PO TABS
5.0000 mg | ORAL_TABLET | Freq: Once | ORAL | Status: AC | PRN
  Administered 2022-11-17: 5 mg via ORAL

## 2022-11-17 MED ORDER — KETOROLAC TROMETHAMINE 30 MG/ML IJ SOLN: 30.0000 mg | Freq: Once | INTRAMUSCULAR | Status: AC

## 2022-11-17 MED ORDER — MIDAZOLAM HCL 2 MG/2ML IJ SOLN
INTRAMUSCULAR | Status: AC
  Filled 2022-11-17: qty 2

## 2022-11-17 MED ORDER — FAMOTIDINE 20 MG PO TABS
ORAL_TABLET | ORAL | Status: AC
  Administered 2022-11-17: 20 mg via ORAL
  Filled 2022-11-17: qty 1

## 2022-11-17 MED ORDER — CEFAZOLIN SODIUM-DEXTROSE 2-4 GM/100ML-% IV SOLN
INTRAVENOUS | Status: AC
  Filled 2022-11-17: qty 100

## 2022-11-17 MED ORDER — FENTANYL CITRATE (PF) 100 MCG/2ML IJ SOLN: 25.0000 ug | INTRAMUSCULAR | Status: DC | PRN

## 2022-11-17 MED ORDER — EPINEPHRINE PF 1 MG/ML IJ SOLN
INTRAMUSCULAR | Status: AC
  Filled 2022-11-17: qty 1

## 2022-11-17 MED ORDER — PROPOFOL 10 MG/ML IV BOLUS
INTRAVENOUS | Status: DC | PRN
  Administered 2022-11-17: 60 mg via INTRAVENOUS
  Administered 2022-11-17: 40 mg via INTRAVENOUS

## 2022-11-17 MED ORDER — CHLORHEXIDINE GLUCONATE 0.12 % MT SOLN
OROMUCOSAL | Status: AC
  Administered 2022-11-17: 15 mL via OROMUCOSAL
  Filled 2022-11-17: qty 15

## 2022-11-17 MED ORDER — OXYCODONE HCL 5 MG/5ML PO SOLN: 5.0000 mg | Freq: Once | ORAL | Status: AC | PRN

## 2022-11-17 MED ORDER — MIDAZOLAM HCL 2 MG/2ML IJ SOLN
INTRAMUSCULAR | Status: DC | PRN
  Administered 2022-11-17: 2 mg via INTRAVENOUS

## 2022-11-17 MED ORDER — HYDROCODONE-ACETAMINOPHEN 5-325 MG PO TABS: 1.0000 | ORAL_TABLET | Freq: Four times a day (QID) | ORAL | 0 refills | Status: AC | PRN

## 2022-11-17 MED ORDER — PROPOFOL 1000 MG/100ML IV EMUL
INTRAVENOUS | Status: AC
  Filled 2022-11-17: qty 100

## 2022-11-17 MED ORDER — TRIAMCINOLONE ACETONIDE 40 MG/ML IJ SUSP
INTRAMUSCULAR | Status: DC | PRN
  Administered 2022-11-17: 10 mL via INTRAMUSCULAR

## 2022-11-17 MED ORDER — FENTANYL CITRATE (PF) 100 MCG/2ML IJ SOLN
INTRAMUSCULAR | Status: DC | PRN
  Administered 2022-11-17: 50 ug via INTRAVENOUS

## 2022-11-17 MED ORDER — TRIAMCINOLONE ACETONIDE 40 MG/ML IJ SUSP
INTRAMUSCULAR | Status: AC
  Filled 2022-11-17: qty 1

## 2022-11-17 MED ORDER — BUPIVACAINE HCL (PF) 0.25 % IJ SOLN
INTRAMUSCULAR | Status: AC
  Filled 2022-11-17: qty 30

## 2022-11-17 MED ORDER — KETOROLAC TROMETHAMINE 30 MG/ML IJ SOLN
INTRAMUSCULAR | Status: AC
  Administered 2022-11-17: 30 mg via INTRAVENOUS
  Filled 2022-11-17: qty 1

## 2022-11-17 MED ORDER — FENTANYL CITRATE (PF) 100 MCG/2ML IJ SOLN
INTRAMUSCULAR | Status: AC
  Filled 2022-11-17: qty 2

## 2022-11-17 SURGICAL SUPPLY — 7 items
BNDG ADH 1X3 SHEER STRL LF (GAUZE/BANDAGES/DRESSINGS) ×1 IMPLANT
BNDG ADH THN 3X1 STRL LF (GAUZE/BANDAGES/DRESSINGS) ×1
NDL HYPO 21X1.5 SAFETY (NEEDLE) ×1 IMPLANT
NEEDLE HYPO 21X1.5 SAFETY (NEEDLE) ×1 IMPLANT
PAD ALCOHOL SWAB (MISCELLANEOUS) ×2 IMPLANT
SLING ARM M TX990204 (SOFTGOODS) ×1 IMPLANT
SYR 10ML LL (SYRINGE) ×1 IMPLANT

## 2022-11-17 NOTE — Op Note (Signed)
11/17/2022  9:39 AM  Patient:   Jacqueline Wolfe  Pre-Op Diagnosis:   Secondary adhesive capsulitis, left shoulder.  Post-Op Diagnosis:   Same  Procedure:   Manipulation under anesthesia with steroid injection, left shoulder.  Surgeon:   Pascal Lux, MD  Assistant:   None  Anesthesia:   IV sedation  Findings:   As above. Prior to manipulation, the left shoulder could be forward flexed to 150 and abducted to 145. At 90 of abduction, the shoulder could be externally rotated to 70 and internally rotated to 45. Following manipulation, the shoulder could be forward flexed to 175, abducted to 170 and, at 90 of abduction, externally rotated to 95 and internally rotated to 70.  Complications:   None  EBL:   0 cc  Fluids:   200 cc crystalloid  TT:   None  Drains:   None  Closure:   None  Brief Clinical Note:   The patient is a 52 year old female who is now 3 months status post a midshaft left clavicle fracture treated nonsurgically. Despite extensive physical therapy, the patient continues to have difficulty regaining shoulder range of motion. The patient's history and examination are consistent with adhesive capsulitis. The patient presents at this time for a manipulation under anesthesia with steroid injection of the left shoulder.  Procedure:   The patient was brought into the operating room and lain in the supine position. After adequate IV sedation was achieved, a timeout was performed to verify the correct surgical site. The left shoulder was gently manipulated in both abduction and external rotation, as well as adduction and internal rotation. Several palpable and audible pops were heard as the scar tissue released, permitting full range of motion of the shoulder. The glenohumeral joint was injected sterilely using 1 cc of Kenalog-40 and 9 cc of 0.25% Sensorcaine with epinephrine before the patient was placed into a sling. The patient was then awakened and returned to the  recovery room in satisfactory condition after tolerating the procedure well.

## 2022-11-17 NOTE — Anesthesia Preprocedure Evaluation (Signed)
Anesthesia Evaluation  Patient identified by MRN, date of birth, ID band Patient awake    Reviewed: Allergy & Precautions, NPO status , Patient's Chart, lab work & pertinent test results  History of Anesthesia Complications Negative for: history of anesthetic complications  Airway Mallampati: III  TM Distance: >3 FB Neck ROM: full    Dental  (+) Dental Advidsory Given, Teeth Intact   Pulmonary neg pulmonary ROS, neg COPD, Current Smoker and Patient abstained from smoking.   Pulmonary exam normal        Cardiovascular (-) Past MI and (-) CABG negative cardio ROS Normal cardiovascular exam     Neuro/Psych  PSYCHIATRIC DISORDERS      negative neurological ROS     GI/Hepatic negative GI ROS, Neg liver ROS,,,  Endo/Other  negative endocrine ROS    Renal/GU negative Renal ROS  negative genitourinary   Musculoskeletal   Abdominal   Peds  Hematology negative hematology ROS (+)   Anesthesia Other Findings Past Medical History: 08/01/2022: Broken collarbone     Comment:  left No date: Depression  Past Surgical History: 1984: TONSILLECTOMY  BMI    Body Mass Index: 21.13 kg/m      Reproductive/Obstetrics negative OB ROS                             Anesthesia Physical Anesthesia Plan  ASA: 2  Anesthesia Plan: General   Post-op Pain Management:    Induction: Intravenous  PONV Risk Score and Plan: 3 and Propofol infusion, TIVA and Midazolam  Airway Management Planned: Mask  Additional Equipment:   Intra-op Plan:   Post-operative Plan:   Informed Consent: I have reviewed the patients History and Physical, chart, labs and discussed the procedure including the risks, benefits and alternatives for the proposed anesthesia with the patient or authorized representative who has indicated his/her understanding and acceptance.     Dental Advisory Given  Plan Discussed with:  Anesthesiologist, CRNA and Surgeon  Anesthesia Plan Comments: (Patient consented for risks of anesthesia including but not limited to:  - adverse reactions to medications - risk of airway placement if required - damage to eyes, teeth, lips or other oral mucosa - nerve damage due to positioning  - sore throat or hoarseness - Damage to heart, brain, nerves, lungs, other parts of body or loss of life  Patient voiced understanding.)       Anesthesia Quick Evaluation

## 2022-11-17 NOTE — Discharge Instructions (Addendum)
AMBULATORY SURGERY  DISCHARGE INSTRUCTIONS   The drugs that you were given will stay in your system until tomorrow so for the next 24 hours you should not:  Drive an automobile Make any legal decisions Drink any alcoholic beverage   You may resume regular meals tomorrow.  Today it is better to start with liquids and gradually work up to solid foods.  You may eat anything you prefer, but it is better to start with liquids, then soup and crackers, and gradually work up to solid foods.   Please notify your doctor immediately if you have any unusual bleeding, trouble breathing, redness and pain at the surgery site, drainage, fever, or pain not relieved by medication.     Your post-operative visit with Dr.                                       is: Date:                        Time:    Please call to schedule your post-operative visit.  Additional Instructions:  Orthopedic discharge instructions: May shower as needed.  Apply ice frequently to shoulder. Take meloxicam 7.5 mg BID OR ibuprofen 600-800 mg TID with meals for 7-10 days, then as necessary. Take hydrocodone as prescribed if needed.  May supplement with ES Tylenol as necessary. May use sling for comfort tonight as needed, but try to discontinue using it tomorrow. Start physical therapy as scheduled tomorrow. Follow-up in 10-14 days or as scheduled.

## 2022-11-17 NOTE — Transfer of Care (Signed)
Immediate Anesthesia Transfer of Care Note  Patient: Jacqueline Wolfe  Procedure(s) Performed: CLOSED MANIPULATION SHOULDER WITH STEROID INJECTION (Left: Shoulder)  Patient Location: PACU  Anesthesia Type:General  Level of Consciousness: drowsy and patient cooperative  Airway & Oxygen Therapy: Patient Spontanous Breathing and Patient connected to face mask oxygen  Post-op Assessment: Report given to RN and Post -op Vital signs reviewed and stable  Post vital signs: Reviewed and stable  Last Vitals:  Vitals Value Taken Time  BP 116/80 11/17/22 0928  Temp    Pulse 82 11/17/22 0930  Resp 16 11/17/22 0930  SpO2 100 % 11/17/22 0930  Vitals shown include unvalidated device data.  Last Pain:  Vitals:   11/17/22 0759  PainSc: 0-No pain         Complications: No notable events documented.

## 2022-11-17 NOTE — Anesthesia Postprocedure Evaluation (Signed)
Anesthesia Post Note  Patient: Jacqueline Wolfe  Procedure(s) Performed: CLOSED MANIPULATION SHOULDER WITH STEROID INJECTION (Left: Shoulder)  Patient location during evaluation: PACU Anesthesia Type: General Level of consciousness: awake and alert Pain management: pain level controlled Vital Signs Assessment: post-procedure vital signs reviewed and stable Respiratory status: spontaneous breathing, nonlabored ventilation, respiratory function stable and patient connected to nasal cannula oxygen Cardiovascular status: blood pressure returned to baseline and stable Postop Assessment: no apparent nausea or vomiting Anesthetic complications: no  No notable events documented.   Last Vitals:  Vitals:   11/17/22 1000 11/17/22 1010  BP: 122/72 120/82  Pulse: 68 72  Resp: 14 16  Temp: 36.7 C (!) 36.2 C  SpO2: 100% 99%    Last Pain:  Vitals:   11/17/22 1010  TempSrc: Temporal  PainSc:                  Dimas Millin

## 2022-11-17 NOTE — H&P (Signed)
HPI:  Jacqueline Wolfe is a 52 y.o. female who presents today for a second opinion regarding persistent symptoms of pain and stiffness following a left shoulder injury.  The patient's symptoms began 3 months ago and developed as a result of an injury in which she apparently tripped and fell while [place], landing on her left shoulder. She presented to a local urgent care clinic where x-rays demonstrated a comminuted displaced midshaft left clavicle fracture. She was advised to seek orthopedic care upon her return home so she went to the [facility] urgent care clinic. She was placed into a figure-of-eight brace and remained in that for 6 weeks . The patient was then started in physical therapy for regaining range of motion and strength. However, she still has difficulty bringing her arm even to shoulder level. She feels that she has plateaued in therapy and that there is a mechanical block to her being able to gain any further range of motion. She did not feel that she was being listened to by her care providers and therapists so she has elected to seek a second opinion. The patient describes the symptoms as marked (major pain with significant limitations) and have the quality of being aching, miserable, nagging, stabbing, tender, and throbbing. The pain is localized to the posterior shoulder. These symptoms are aggravated with normal daily activities, with sleeping, at higher levels of activity, with overhead activity, reaching behind the back, and getting dressed. She has tried acetaminophen, non-steroidal anti-inflammatories (Mobic), and narcotics with limited benefit. She has tried physical therapy , rest , and ice with no substantial benefit. She has not tried any steroid injections for the symptoms. The patient denies any neck pain, nor does she note any numbness or paresthesias down her arm to her hand. She is right-hand dominant.  This complaint is not work related. She is a  sports non-participant.  Shoulder Surgical History:  The patient has had no shoulder surgery in the past.  PMH/PSH/Family History/Social History/Meds/Allergies:  I have reviewed past medical, surgical, social and family history, medications and allergies as documented in the EMR.  Current Outpatient Medications: ascorbic acid (CHEWABLE VITAMIN C ORAL) Take by mouth.  biotin 1 mg Cap Take by mouth.  calcium carb/vit D3/minerals (CALCIUM CARBONATE-VIT D3-MIN) 600 mg (1,500 mg)-400 unit Chew Take by mouth.  DENTA 5000 PLUS 1.1 % USE AS DIRECTED ONE TIME DAILY 3  escitalopram oxalate (LEXAPRO) 20 MG tablet Take 1 tablet (20 mg total) by mouth once daily for 180 days 90 tablet 1  LORazepam (ATIVAN) 0.5 MG tablet Take 1 tablet (0.5 mg total) by mouth every 8 (eight) hours as needed for Anxiety for up to 3 doses 3 tablet 0  meloxicam (MOBIC) 7.5 MG tablet Take 7.5 mg by mouth once as needed  multivitamin tablet Take 1 tablet by mouth once daily.   Allergies: No Known Allergies  Past Medical History:  Anxiety  Chickenpox  Seasonal allergies   Past Surgical History:  Procedure Laterality Date  TONSILLECTOMY  WISDOM TEETH   Family History:  Pancreatic cancer Mother  High blood pressure (Hypertension) Father  Sleep apnea Father  Breast cancer Sister  Uterine cancer Sister  Uterine cancer Sister   Social History:   Socioeconomic History:  Marital status: Married  Tobacco Use  Smoking status: Every Day  Packs/day: 1  Types: Cigarettes  Smokeless tobacco: Never  Substance and Sexual Activity  Alcohol use: Yes  Drug use: No  Sexual activity: Yes  Partners: Male  Birth control/protection: Post-menopausal   Review of Systems:  A comprehensive 14 point ROS was performed, reviewed, and the pertinent orthopaedic findings are documented in the HPI.  Physical Exam:  Vitals:  11/06/22 1344  Weight: 59 kg (130 lb)  Height: 165.1 cm (5\' 5" )  PainSc: 3  PainLoc: Shoulder    General/Constitutional: The patient appears to be well-nourished, well-developed, and in no acute distress. Neuro/Psych: Normal mood and affect, oriented to person, place and time. Eyes: Non-icteric. Pupils are equal, round, and reactive to light, and exhibit synchronous movement. ENT: Unremarkable. Lymphatic: No palpable adenopathy. Respiratory: Lungs clear to auscultation, Normal chest excursion, No wheezes, and Non-labored breathing Cardiovascular: Regular rate and rhythm. No murmurs. and No edema, swelling or tenderness, except as noted in detailed exam. Integumentary: No impressive skin lesions present, except as noted in detailed exam. Musculoskeletal: Unremarkable, except as noted in detailed exam.  Left shoulder exam: SKIN: Mild asymmetry of the left clavicle as compared to right clavicle, otherwise unremarkable  SWELLING: none WARMTH: none LYMPH NODES: no adenopathy palpable CREPITUS: none TENDERNESS: Non-tender to palpation along clavicle, but mildly tender to palpation over scapular region posteriorly ROM (active):  Forward flexion: 95 degrees Abduction: 75 degrees Internal rotation: Left PSIS ROM (passive):  Forward flexion: 125 degrees Abduction: 115 degrees ER/IR at 90 abd: 65 degrees / 55 degrees  She describes mild to moderate pain with forward flexion, abduction, internal rotation, and internal rotation at 90 degrees of abduction  STRENGTH: Forward flexion: 4-4+/5 Abduction: 4-4+/5 External rotation: 4-4+/5 Internal rotation: 4+/5 Pain with RC testing: Mild soreness with residual abduction more so than with resisted forward flexion.  STABILITY: Normal  SPECIAL TESTS: Luan Pulling' test: negative Speed's test: Not evaluated Capsulitis - pain w/ passive ER: no Crossed arm test: Mildly positive Crank: Not evaluated Anterior apprehension: Negative Posterior apprehension: Not evaluated  She is neurovascularly intact to the left upper extremity.  Clavicle X-Ray  Imaging: Several views of the left clavicle are obtained. These films demonstrate excellent interval healing of a comminuted four-part midshaft left clavicle fracture with overall reasonable maintenance of alignment. No significant degenerative changes of the glenohumeral or AC joints are noted. No other lytic lesions or acute bony processes are identified.  Assessment:  1. tatus post closed displaced fracture of shaft of left clavicle.  2. Adhesive capsulitis of left shoulder.   Plan:  The treatment options were discussed with the patient. In addition, patient educational materials were provided regarding the diagnosis and treatment options. The patient is quite frustrated by her symptoms and functional limitations and is ready to consider more aggressive treatment options. Given her physical examination findings, I feel that the primary concern at this point is the development of adhesive capsulitis causing her limited shoulder motion. Therefore, I have recommended a surgical procedure, specifically a manipulation under anesthesia with steroid injection of the left shoulder. The procedure was discussed with the patient, as were the potential risks (including bleeding, infection, nerve and/or blood vessel injury, persistent or recurrent pain, residual shoulder stiffness or weakness, proximal humerus fracture, recurrent clavicle fracture, need for further surgery, blood clots, strokes, heart attacks and/or arhythmias, pneumonia, etc.) and benefits. The patient states her understanding and wishes to proceed. In addition, the patient will be sent for CT scan of the left clavicle to verify adequate healing of the clavicle fracture. All of the patient's questions and concerns were answered. She can call any time with further concerns. She will follow up post-surgery, routine.  H&P reviewed and patient re-examined. No changes.

## 2022-11-18 ENCOUNTER — Encounter: Payer: Self-pay | Admitting: Surgery

## 2023-08-11 ENCOUNTER — Other Ambulatory Visit: Payer: Self-pay | Admitting: Obstetrics and Gynecology

## 2023-08-11 ENCOUNTER — Other Ambulatory Visit: Payer: Self-pay | Admitting: *Deleted

## 2023-08-11 DIAGNOSIS — F17210 Nicotine dependence, cigarettes, uncomplicated: Secondary | ICD-10-CM

## 2023-08-11 DIAGNOSIS — Z122 Encounter for screening for malignant neoplasm of respiratory organs: Secondary | ICD-10-CM

## 2023-08-11 DIAGNOSIS — Z1231 Encounter for screening mammogram for malignant neoplasm of breast: Secondary | ICD-10-CM

## 2023-08-11 DIAGNOSIS — Z87891 Personal history of nicotine dependence: Secondary | ICD-10-CM

## 2023-08-13 ENCOUNTER — Encounter: Payer: Self-pay | Admitting: Adult Health

## 2023-08-13 ENCOUNTER — Ambulatory Visit (INDEPENDENT_AMBULATORY_CARE_PROVIDER_SITE_OTHER): Payer: Commercial Managed Care - PPO | Admitting: Adult Health

## 2023-08-13 DIAGNOSIS — F17210 Nicotine dependence, cigarettes, uncomplicated: Secondary | ICD-10-CM | POA: Diagnosis not present

## 2023-08-13 NOTE — Progress Notes (Signed)
  Virtual Visit via Telephone Note  I connected with Jacqueline Wolfe , 08/13/23 8:27 AM by a telemedicine application and verified that I am speaking with the correct person using two identifiers.  Location: Patient: home Provider: home   I discussed the limitations of evaluation and management by telemedicine and the availability of in person appointments. The patient expressed understanding and agreed to proceed.   Shared Decision Making Visit Lung Cancer Screening Program [phone])   Eligibility: 52 y.o. Pack Years Smoking History Calculation = 34 pack years (# packs/per year x # years smoked) Recent History of coughing up blood  no Unexplained weight loss? no ( >Than 15 pounds within the last 6 months ) Prior History Lung / other cancer no (Diagnosis within the last 5 years already requiring surveillance chest CT Scans). Smoking Status Current Smoker   Visit Components: Discussion included one or more decision making aids. YES Discussion included risk/benefits of screening. YES Discussion included potential follow up diagnostic testing for abnormal scans. YES Discussion included meaning and risk of over diagnosis. YES Discussion included meaning and risk of False Positives. YES Discussion included meaning of total radiation exposure. YES  Counseling Included: Importance of adherence to annual lung cancer LDCT screening. YES Impact of comorbidities on ability to participate in the program. YES Ability and willingness to under diagnostic treatment. YES  Smoking Cessation Counseling: Current Smokers:  Discussed importance of smoking cessation. yes Information about tobacco cessation classes and interventions provided to patient. yes Patient provided with "ticket" for LDCT Scan. yes Symptomatic Patient. no Diagnosis Code: Tobacco Use Z72.0 Asymptomatic Patient yes  Counseling (Intermediate counseling: > three minutes counseling) U0454 Information about tobacco cessation  classes and interventions provided to patient. Yes Patient provided with "ticket" for LDCT Scan. yes Written Order for Lung Cancer Screening with LDCT placed in Epic. Yes (CT Chest Lung Cancer Screening Low Dose W/O CM) [id number]  Z12.2-Screening of respiratory organs Z87.891-Personal history of nicotine dependence   [name] 08/13/23

## 2023-08-13 NOTE — Patient Instructions (Signed)

## 2023-08-17 ENCOUNTER — Ambulatory Visit
Admission: RE | Admit: 2023-08-17 | Discharge: 2023-08-17 | Disposition: A | Discharge status: Home / Self Care | Payer: Commercial Managed Care - PPO | Source: Ambulatory Visit | Attending: Acute Care | Admitting: Acute Care

## 2023-08-17 DIAGNOSIS — Z87891 Personal history of nicotine dependence: Secondary | ICD-10-CM | POA: Diagnosis present

## 2023-08-17 DIAGNOSIS — F17210 Nicotine dependence, cigarettes, uncomplicated: Secondary | ICD-10-CM | POA: Insufficient documentation

## 2023-08-17 DIAGNOSIS — Z122 Encounter for screening for malignant neoplasm of respiratory organs: Secondary | ICD-10-CM | POA: Diagnosis present

## 2023-08-31 ENCOUNTER — Ambulatory Visit
Admission: RE | Admit: 2023-08-31 | Discharge: 2023-08-31 | Disposition: A | Discharge status: Home / Self Care | Payer: Commercial Managed Care - PPO | Source: Ambulatory Visit | Attending: Obstetrics and Gynecology | Admitting: Obstetrics and Gynecology

## 2023-08-31 DIAGNOSIS — Z1231 Encounter for screening mammogram for malignant neoplasm of breast: Secondary | ICD-10-CM | POA: Diagnosis present

## 2023-09-10 ENCOUNTER — Other Ambulatory Visit: Payer: Self-pay | Admitting: Acute Care

## 2023-09-10 DIAGNOSIS — Z122 Encounter for screening for malignant neoplasm of respiratory organs: Secondary | ICD-10-CM

## 2023-09-10 DIAGNOSIS — F17210 Nicotine dependence, cigarettes, uncomplicated: Secondary | ICD-10-CM

## 2023-09-10 DIAGNOSIS — Z87891 Personal history of nicotine dependence: Secondary | ICD-10-CM

## 2024-10-23 ENCOUNTER — Other Ambulatory Visit: Payer: Self-pay | Admitting: Obstetrics and Gynecology

## 2024-10-23 DIAGNOSIS — Z1231 Encounter for screening mammogram for malignant neoplasm of breast: Secondary | ICD-10-CM

## 2024-11-09 ENCOUNTER — Other Ambulatory Visit: Payer: Self-pay

## 2024-11-09 ENCOUNTER — Other Ambulatory Visit: Payer: Self-pay | Admitting: Medical Genetics

## 2024-11-09 DIAGNOSIS — Z87891 Personal history of nicotine dependence: Secondary | ICD-10-CM

## 2024-11-09 DIAGNOSIS — F17210 Nicotine dependence, cigarettes, uncomplicated: Secondary | ICD-10-CM

## 2024-11-09 DIAGNOSIS — Z122 Encounter for screening for malignant neoplasm of respiratory organs: Secondary | ICD-10-CM

## 2024-11-16 ENCOUNTER — Encounter

## 2024-11-16 ENCOUNTER — Other Ambulatory Visit: Payer: Self-pay

## 2024-11-22 ENCOUNTER — Ambulatory Visit
